# Patient Record
Sex: Female | Born: 1990 | Race: White | Hispanic: No | Marital: Single | Smoking: Former smoker
Health system: Southern US, Community
[De-identification: ages and names within clinical notes are randomized; demographics above are authoritative.]

## PROBLEM LIST (undated history)

## (undated) HISTORY — PX: FEMUR FRACTURE SURGERY: SHX633

---

## 2018-09-07 ENCOUNTER — Emergency Department (HOSPITAL_BASED_OUTPATIENT_CLINIC_OR_DEPARTMENT_OTHER): Payer: Self-pay

## 2018-09-07 ENCOUNTER — Other Ambulatory Visit: Payer: Self-pay

## 2018-09-07 ENCOUNTER — Emergency Department (HOSPITAL_BASED_OUTPATIENT_CLINIC_OR_DEPARTMENT_OTHER)
Admission: EM | Admit: 2018-09-07 | Discharge: 2018-09-07 | Disposition: A | Discharge status: Home / Self Care | Payer: Self-pay | Attending: Emergency Medicine | Admitting: Emergency Medicine

## 2018-09-07 ENCOUNTER — Encounter (HOSPITAL_BASED_OUTPATIENT_CLINIC_OR_DEPARTMENT_OTHER): Payer: Self-pay | Admitting: *Deleted

## 2018-09-07 DIAGNOSIS — Z87891 Personal history of nicotine dependence: Secondary | ICD-10-CM | POA: Insufficient documentation

## 2018-09-07 DIAGNOSIS — W230XXA Caught, crushed, jammed, or pinched between moving objects, initial encounter: Secondary | ICD-10-CM | POA: Insufficient documentation

## 2018-09-07 DIAGNOSIS — T1490XA Injury, unspecified, initial encounter: Secondary | ICD-10-CM | POA: Insufficient documentation

## 2018-09-07 DIAGNOSIS — Y999 Unspecified external cause status: Secondary | ICD-10-CM | POA: Insufficient documentation

## 2018-09-07 DIAGNOSIS — R1084 Generalized abdominal pain: Secondary | ICD-10-CM | POA: Insufficient documentation

## 2018-09-07 DIAGNOSIS — R0789 Other chest pain: Secondary | ICD-10-CM | POA: Insufficient documentation

## 2018-09-07 DIAGNOSIS — Y9389 Activity, other specified: Secondary | ICD-10-CM | POA: Insufficient documentation

## 2018-09-07 DIAGNOSIS — Y92812 Truck as the place of occurrence of the external cause: Secondary | ICD-10-CM | POA: Insufficient documentation

## 2018-09-07 LAB — CBC WITH DIFFERENTIAL/PLATELET
Abs Immature Granulocytes: 0.04 10*3/uL (ref 0.00–0.07)
Basophils Absolute: 0 10*3/uL (ref 0.0–0.1)
Basophils Relative: 1 %
Eosinophils Absolute: 0.4 10*3/uL (ref 0.0–0.5)
Eosinophils Relative: 5 %
HCT: 38.7 % (ref 36.0–46.0)
Hemoglobin: 12.4 g/dL (ref 12.0–15.0)
Immature Granulocytes: 1 %
Lymphocytes Relative: 32 %
Lymphs Abs: 2.4 10*3/uL (ref 0.7–4.0)
MCH: 28.6 pg (ref 26.0–34.0)
MCHC: 32 g/dL (ref 30.0–36.0)
MCV: 89.4 fL (ref 80.0–100.0)
Monocytes Absolute: 0.6 10*3/uL (ref 0.1–1.0)
Monocytes Relative: 9 %
Neutro Abs: 4 10*3/uL (ref 1.7–7.7)
Neutrophils Relative %: 52 %
Platelets: 240 10*3/uL (ref 150–400)
RBC: 4.33 MIL/uL (ref 3.87–5.11)
RDW: 12.5 % (ref 11.5–15.5)
WBC: 7.5 10*3/uL (ref 4.0–10.5)
nRBC: 0 % (ref 0.0–0.2)

## 2018-09-07 LAB — COMPREHENSIVE METABOLIC PANEL
ALT: 21 U/L (ref 0–44)
AST: 19 U/L (ref 15–41)
Albumin: 4 g/dL (ref 3.5–5.0)
Alkaline Phosphatase: 43 U/L (ref 38–126)
Anion gap: 7 (ref 5–15)
BUN: 14 mg/dL (ref 6–20)
CO2: 23 mmol/L (ref 22–32)
Calcium: 8.7 mg/dL — ABNORMAL LOW (ref 8.9–10.3)
Chloride: 106 mmol/L (ref 98–111)
Creatinine, Ser: 0.69 mg/dL (ref 0.44–1.00)
GFR calc Af Amer: >60 mL/min (ref >60)
GFR calc non Af Amer: >60 mL/min (ref >60)
Glucose, Bld: 95 mg/dL (ref 70–99)
Potassium: 3.6 mmol/L (ref 3.5–5.1)
Sodium: 136 mmol/L (ref 135–145)
Total Bilirubin: 0.2 mg/dL — ABNORMAL LOW (ref 0.3–1.2)
Total Protein: 6.9 g/dL (ref 6.5–8.1)

## 2018-09-07 LAB — LIPASE, BLOOD: Lipase: 32 U/L (ref 11–51)

## 2018-09-07 MED ORDER — IOHEXOL 300 MG/ML  SOLN
100.0000 mL | Freq: Once | INTRAMUSCULAR | Status: AC | PRN
  Administered 2018-09-07: 100 mL via INTRAVENOUS

## 2018-09-07 MED ORDER — ACETAMINOPHEN 325 MG PO TABS
650.0000 mg | ORAL_TABLET | Freq: Once | ORAL | Status: AC
  Administered 2018-09-07: 650 mg via ORAL
  Filled 2018-09-07: qty 2

## 2018-09-07 MED ORDER — IBUPROFEN 800 MG PO TABS
800.0000 mg | ORAL_TABLET | Freq: Once | ORAL | Status: AC
  Administered 2018-09-07: 800 mg via ORAL
  Filled 2018-09-07: qty 1
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
## 2018-09-07 NOTE — ED Notes (Signed)
Taken to CT at this time. 
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
## 2020-03-22 IMAGING — CT CT CHEST WITH CONTRAST
1 of 10 series · 1 of 33 positions shown · IV contrast (APPLIED)
Comparison: None.

CLINICAL DATA: Patient working on truck when it fell on her.

EXAM:
CT CHEST, ABDOMEN, AND PELVIS WITH CONTRAST
TECHNIQUE: Multidetector CT imaging of the chest, abdomen and pelvis was
performed following the standard protocol during bolus
administration of intravenous contrast.
CONTRAST:  100mL OMNIPAQUE IOHEXOL 300 MG/ML  SOLN

[Series 2: cap with 2 · axial · 0.83mm/px · 1 of 137 slices shown]
[im 69/137  soft-tissue]
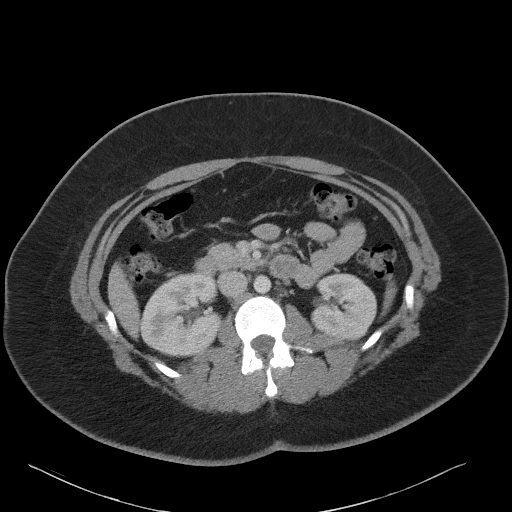

[1 of 33 positions shown; findings below may reference images not displayed]

FINDINGS: CT CHEST FINDINGS

Cardiovascular: No significant vascular findings. Normal heart size.
No pericardial effusion.

Mediastinum/Nodes: No enlarged mediastinal, hilar, or axillary lymph
nodes. Thyroid gland, trachea, and esophagus demonstrate no
significant findings.

Lungs/Pleura: Lungs are clear. No pleural effusion or pneumothorax.

Musculoskeletal: No chest wall mass or suspicious bone lesions
identified.

CT ABDOMEN PELVIS FINDINGS

Hepatobiliary: No focal liver abnormality is seen. No gallstones,
gallbladder wall thickening, or biliary dilatation.

Pancreas: Unremarkable. No pancreatic ductal dilatation or
surrounding inflammatory changes.

Spleen: Normal in size without focal abnormality.

Adrenals/Urinary Tract: Adrenal glands are unremarkable. Kidneys are
normal, without renal calculi, focal lesion, or hydronephrosis.
Bladder is unremarkable.

Stomach/Bowel: Stomach is within normal limits. Appendix appears
normal. No evidence of bowel wall thickening, distention, or
inflammatory changes.

Vascular/Lymphatic: No significant vascular findings are present. No
enlarged abdominal or pelvic lymph nodes.

Reproductive: Corpus luteum cyst is seen in the left ovary. The
uterus and right adnexa are unremarkable.

Other: There is a small amount of fluid in the pelvis, likely
physiologic.

Musculoskeletal: No acute or significant osseous findings.
IMPRESSION: 1. No traumatic changes in the chest, abdomen, or pelvis. No acute
abnormalities.
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
## 2022-07-19 ENCOUNTER — Other Ambulatory Visit (HOSPITAL_COMMUNITY)
Admission: RE | Admit: 2022-07-19 | Discharge: 2022-07-19 | Disposition: A | Discharge status: Home / Self Care | Payer: Self-pay | Source: Ambulatory Visit | Attending: Nurse Practitioner | Admitting: Nurse Practitioner

## 2022-07-19 ENCOUNTER — Other Ambulatory Visit: Payer: Self-pay | Admitting: Nurse Practitioner

## 2022-07-19 DIAGNOSIS — Z124 Encounter for screening for malignant neoplasm of cervix: Secondary | ICD-10-CM | POA: Insufficient documentation

## 2022-07-23 LAB — CYTOLOGY - PAP
Comment: NEGATIVE
Diagnosis: NEGATIVE
High risk HPV: NEGATIVE

## 2023-04-06 ENCOUNTER — Emergency Department (HOSPITAL_BASED_OUTPATIENT_CLINIC_OR_DEPARTMENT_OTHER)
Admission: EM | Admit: 2023-04-06 | Discharge: 2023-04-06 | Disposition: A | Discharge status: Home / Self Care | Payer: Self-pay | Attending: Emergency Medicine | Admitting: Emergency Medicine

## 2023-04-06 ENCOUNTER — Other Ambulatory Visit: Payer: Self-pay

## 2023-04-06 ENCOUNTER — Encounter (HOSPITAL_BASED_OUTPATIENT_CLINIC_OR_DEPARTMENT_OTHER): Payer: Self-pay

## 2023-04-06 DIAGNOSIS — R1011 Right upper quadrant pain: Secondary | ICD-10-CM | POA: Insufficient documentation

## 2023-04-06 LAB — URINALYSIS, ROUTINE W REFLEX MICROSCOPIC
Bilirubin Urine: NEGATIVE
Glucose, UA: NEGATIVE mg/dL
Hgb urine dipstick: NEGATIVE
Ketones, ur: NEGATIVE mg/dL
Leukocytes,Ua: NEGATIVE
Nitrite: NEGATIVE
Protein, ur: NEGATIVE mg/dL
Specific Gravity, Urine: >=1.03 (ref 1.005–1.030)
pH: 7 (ref 5.0–8.0)

## 2023-04-06 LAB — COMPREHENSIVE METABOLIC PANEL
ALT: 19 U/L (ref 0–44)
AST: 15 U/L (ref 15–41)
Albumin: 3.8 g/dL (ref 3.5–5.0)
Alkaline Phosphatase: 38 U/L (ref 38–126)
Anion gap: 7 (ref 5–15)
BUN: 10 mg/dL (ref 6–20)
CO2: 24 mmol/L (ref 22–32)
Calcium: 8.7 mg/dL — ABNORMAL LOW (ref 8.9–10.3)
Chloride: 105 mmol/L (ref 98–111)
Creatinine, Ser: 0.77 mg/dL (ref 0.44–1.00)
GFR, Estimated: >60 mL/min (ref >60)
Glucose, Bld: 94 mg/dL (ref 70–99)
Potassium: 4.1 mmol/L (ref 3.5–5.1)
Sodium: 136 mmol/L (ref 135–145)
Total Bilirubin: 0.6 mg/dL (ref <1.2)
Total Protein: 6.4 g/dL — ABNORMAL LOW (ref 6.5–8.1)

## 2023-04-06 LAB — CBC
HCT: 39.4 % (ref 36.0–46.0)
Hemoglobin: 13 g/dL (ref 12.0–15.0)
MCH: 29.7 pg (ref 26.0–34.0)
MCHC: 33 g/dL (ref 30.0–36.0)
MCV: 90 fL (ref 80.0–100.0)
Platelets: 267 10*3/uL (ref 150–400)
RBC: 4.38 MIL/uL (ref 3.87–5.11)
RDW: 12.9 % (ref 11.5–15.5)
WBC: 7.8 10*3/uL (ref 4.0–10.5)
nRBC: 0 % (ref 0.0–0.2)

## 2023-04-06 LAB — PREGNANCY, URINE: Preg Test, Ur: NEGATIVE

## 2023-04-06 LAB — LIPASE, BLOOD: Lipase: 30 U/L (ref 11–51)
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
## 2023-04-06 NOTE — ED Provider Notes (Cosign Needed Addendum)
[place] EMERGENCY DEPARTMENT AT [facility] Provider Note   CSN: [id number] Arrival date & time: 04/06/23  1516     History  Chief Complaint  Patient presents with   Abdominal Pain    Heidi Stout is a 32 y.o. female with history of stomach ulcer, GERD, presents with concern for intermittent right upper quadrant pain over the last week.  States the pain comes and goes, but notes that it especially comes after she eats a meal.  Denies any nausea, vomiting, fever or chills.  Reports last bowel movement this morning. Normally has bowel movements every 3 to 4 days.   Abdominal Pain      Home Medications Prior to Admission medications   Not on File      Allergies    Hydrocodone    Review of Systems   Review of Systems  Gastrointestinal:  Positive for abdominal pain.    Physical Exam Updated Vital Signs BP 123/80   Pulse 78   Temp 98.6 F (37 C) (Oral)   Resp 18   Ht 5\' 7"  (1.702 m)   Wt 116 kg   LMP 03/25/2023   SpO2 99%   BMI 40.05 kg/m  Physical Exam Vitals and nursing note reviewed.  Constitutional:      General: She is not in acute distress.    Appearance: She is well-developed.     Comments: Well-appearing, no vomiting Moves around comfortably  HENT:     Head: Normocephalic and atraumatic.  Eyes:     Conjunctiva/sclera: Conjunctivae normal.  Cardiovascular:     Rate and Rhythm: Normal rate and regular rhythm.     Heart sounds: No murmur heard. Pulmonary:     Effort: Pulmonary effort is normal. No respiratory distress.     Breath sounds: Normal breath sounds.  Abdominal:     Palpations: Abdomen is soft.     Tenderness: There is no abdominal tenderness.     Comments: Abdomen soft nontender to palpation, no rebound or guarding Negative Murphy sign  Musculoskeletal:        General: No swelling.     Cervical back: Neck supple.  Skin:    General: Skin is warm and dry.     Capillary Refill: Capillary refill takes less than 2  seconds.  Neurological:     Mental Status: She is alert.  Psychiatric:        Mood and Affect: Mood normal.     ED Results / Procedures / Treatments   Labs (all labs ordered are listed, but only abnormal results are displayed) Labs Reviewed  COMPREHENSIVE METABOLIC PANEL - Abnormal; Notable for the following components:      Result Value   Calcium 8.7 (*)    Total Protein 6.4 (*)    All other components within normal limits  URINALYSIS, ROUTINE W REFLEX MICROSCOPIC - Abnormal; Notable for the following components:   APPearance HAZY (*)    All other components within normal limits  LIPASE, BLOOD  CBC  PREGNANCY, URINE    EKG None  Radiology No results found.  Procedures Procedures    Medications Ordered in ED Medications - No data to display  ED Course/ Medical Decision Making/ A&P                                 Medical Decision Making Amount and/or Complexity of Data Reviewed Labs: ordered. Radiology: ordered.  Differential diagnosis includes but is not limited to Cholelithiasis, cholangitis, choledocholithiasis, peptic ulcer, gastritis, gastroenteritis, appendicitis, IBS, IBD, DKA, nephrolithiasis, UTI, pyelonephritis, pancreatitis, diverticulitis, mesenteric ischemia, abdominal aortic aneurysm, small bowel obstruction, volvulus, testicular torsion, ovarian torsion, and females of childbearing age pregnancy   ED Course:  Patient well-appearing, stable vital signs.  Moves around comfortably in bed.  Her lab work is very reassuring.  CBC without leukocytosis, CMP with no elevation in LFTs, normal creatinine, normal electrolytes aside from a slightly low calcium of 8.7.  Lipase within normal limits.  Urine pregnancy negative.  Urinalysis with high specific gravity, but no nitrites, leukocytes, no concern for infection. With patient's description of intermittent right upper quadrant pain especially after eating, this does make me concern for possible biliary  colic.  No signs of cholangitis on labs, and she is nontender to palpation of the right upper quadrant at this time.  I recommended getting a  right upper quadrant ultrasound to further evaluate this.  We do not currently have ultrasound available at this time.  Discussed with patient that we can do this as an outpatient study, she in agreement.  I placed the outpatient study order and reached out to radiology, they are aware patient will be coming tomorrow morning to get this performed.  We discussed that if there are gallstones visualized on the right upper quadrant ultrasound, she should follow-up with general surgery to discuss possible removal of her gallbladder if her pain is bothersome. No concern for any other acute abdominal pathology at this time.  Patient stable and appropriate for discharge home.   Impression: Remittent right upper quadrant pain  Disposition:  The patient was discharged home with instructions to return tomorrow for right upper quadrant ultrasound.  Follow-up with general surgery if gallstones visualized and her pain continues to be bothersome. Return precautions given.              Final Clinical Impression(s) / ED Diagnoses Final diagnoses:  Right upper quadrant pain    Rx / DC Orders ED Discharge Orders          Ordered    US Abdomen Limited RUQ (LIVER/GB)        04/06/23 2057              Arabella Merles, PA-C 04/07/23 0002    Arabella Merles, PA-C 04/07/23 0002

## 2023-04-06 NOTE — ED Triage Notes (Signed)
The patient having right side abd pain for three days. No vomiting but is having diarrhea.

## 2023-04-06 NOTE — Discharge Instructions (Signed)
You have been scheduled for a right upper quadrant ultrasound.  You may come anytime tomorrow morning starting at 8:30 AM.  Please come to the ER check-in and tell them you have an appointment to get this ultrasound.  This will help [place] evaluate for possible gallstones as the cause of your pain.  If you are found to have gallstones on your ultrasound, please contact the general surgery office listed below for further management.  They will be able to discuss possibly taking out your gallbladder to look with the pain.  Your lab work today looked reassuring.  We checked your blood counts which are normal.  Your kidney, liver, pancreas labs are normal.  Your urine showed you are dehydrated, but no signs of infection.  Your electrolytes are normal aside from a slightly low calcium.  Please increase your dietary intake of calcium at home with foods such as dairy products.  Return the ER for any worsening or pain, unexplained fevers, nausea or vomiting, any other new or concerning symptoms.

## 2023-04-06 NOTE — ED Notes (Signed)
Pt came in for RUQ abd pain, needs an [place], no [place] in building. EDP aware and working with patient to figure out best care plan.

## 2023-04-07 ENCOUNTER — Ambulatory Visit (HOSPITAL_BASED_OUTPATIENT_CLINIC_OR_DEPARTMENT_OTHER)
Admission: RE | Admit: 2023-04-07 | Discharge: 2023-04-07 | Disposition: A | Discharge status: Home / Self Care | Payer: Self-pay | Source: Ambulatory Visit | Attending: Emergency Medicine | Admitting: Emergency Medicine

## 2023-04-07 DIAGNOSIS — R1011 Right upper quadrant pain: Secondary | ICD-10-CM | POA: Insufficient documentation

## 2023-04-07 DIAGNOSIS — K8020 Calculus of gallbladder without cholecystitis without obstruction: Secondary | ICD-10-CM | POA: Insufficient documentation
